# Patient Record
Sex: Female | Born: 1950 | Race: White | Hispanic: No | Marital: Single | Smoking: Former smoker
Health system: Southern US, Community
[De-identification: ages and names within clinical notes are randomized; demographics above are authoritative.]

## PROBLEM LIST (undated history)

## (undated) HISTORY — PX: HIATAL HERNIA REPAIR: SHX195

## (undated) HISTORY — PX: CHOLECYSTECTOMY: SHX55

---

## 2013-11-21 ENCOUNTER — Observation Stay: Payer: Self-pay | Admitting: Surgery

## 2013-11-21 LAB — CBC WITH DIFFERENTIAL/PLATELET
BASOS ABS: 0.1 10*3/uL (ref 0.0–0.1)
BASOS PCT: 0.7 %
EOS ABS: 0 10*3/uL (ref 0.0–0.7)
EOS PCT: 0.3 %
HCT: 43.3 % (ref 35.0–47.0)
HGB: 14.1 g/dL (ref 12.0–16.0)
LYMPHS ABS: 1.1 10*3/uL (ref 1.0–3.6)
Lymphocyte %: 10.4 %
MCH: 29.3 pg (ref 26.0–34.0)
MCHC: 32.5 g/dL (ref 32.0–36.0)
MCV: 90 fL (ref 80–100)
Monocyte #: 0.3 x10 3/mm (ref 0.2–0.9)
Monocyte %: 2.5 %
NEUTROS PCT: 86.1 %
Neutrophil #: 9.4 10*3/uL — ABNORMAL HIGH (ref 1.4–6.5)
PLATELETS: 304 10*3/uL (ref 150–440)
RBC: 4.81 10*6/uL (ref 3.80–5.20)
RDW: 13.4 % (ref 11.5–14.5)
WBC: 10.9 10*3/uL (ref 3.6–11.0)

## 2013-11-21 LAB — COMPREHENSIVE METABOLIC PANEL
ALBUMIN: 3.9 g/dL (ref 3.4–5.0)
ANION GAP: 9 (ref 7–16)
Alkaline Phosphatase: 81 U/L
BILIRUBIN TOTAL: 0.2 mg/dL (ref 0.2–1.0)
BUN: 12 mg/dL (ref 7–18)
CHLORIDE: 105 mmol/L (ref 98–107)
CREATININE: 0.95 mg/dL (ref 0.60–1.30)
Calcium, Total: 8.5 mg/dL (ref 8.5–10.1)
Co2: 24 mmol/L (ref 21–32)
EGFR (African American): >60
EGFR (Non-African Amer.): >60
Glucose: 139 mg/dL — ABNORMAL HIGH (ref 65–99)
OSMOLALITY: 278 (ref 275–301)
Potassium: 3.4 mmol/L — ABNORMAL LOW (ref 3.5–5.1)
SGOT(AST): 26 U/L (ref 15–37)
SGPT (ALT): 30 U/L
Sodium: 138 mmol/L (ref 136–145)
TOTAL PROTEIN: 8 g/dL (ref 6.4–8.2)

## 2013-11-21 LAB — URINALYSIS, COMPLETE
BACTERIA: NONE SEEN
Bilirubin,UR: NEGATIVE
Blood: NEGATIVE
Glucose,UR: 50 mg/dL (ref 0–75)
Leukocyte Esterase: NEGATIVE
Nitrite: NEGATIVE
Ph: 7 (ref 4.5–8.0)
Protein: NEGATIVE
SQUAMOUS EPITHELIAL: NONE SEEN
Specific Gravity: 1.016 (ref 1.003–1.030)
WBC UR: 1 /HPF (ref 0–5)

## 2013-11-21 LAB — TROPONIN I: Troponin-I: <0.02 ng/mL

## 2013-11-21 LAB — LIPASE, BLOOD: LIPASE: 171 U/L (ref 73–393)

## 2013-11-22 LAB — COMPREHENSIVE METABOLIC PANEL
ALBUMIN: 3 g/dL — AB (ref 3.4–5.0)
ALT: 95 U/L — AB
ANION GAP: 5 — AB (ref 7–16)
AST: 63 U/L — AB (ref 15–37)
Alkaline Phosphatase: 68 U/L
BUN: 14 mg/dL (ref 7–18)
Bilirubin,Total: 0.9 mg/dL (ref 0.2–1.0)
CHLORIDE: 101 mmol/L (ref 98–107)
CO2: 26 mmol/L (ref 21–32)
CREATININE: 1.31 mg/dL — AB (ref 0.60–1.30)
Calcium, Total: 7.9 mg/dL — ABNORMAL LOW (ref 8.5–10.1)
GFR CALC AF AMER: 50 — AB
GFR CALC NON AF AMER: 44 — AB
Glucose: 110 mg/dL — ABNORMAL HIGH (ref 65–99)
Osmolality: 266 (ref 275–301)
POTASSIUM: 4.7 mmol/L (ref 3.5–5.1)
SODIUM: 132 mmol/L — AB (ref 136–145)
Total Protein: 6.6 g/dL (ref 6.4–8.2)

## 2013-11-22 LAB — CBC WITH DIFFERENTIAL/PLATELET
BASOS ABS: 0 10*3/uL (ref 0.0–0.1)
Basophil %: 0.3 %
EOS PCT: 0.3 %
Eosinophil #: 0 10*3/uL (ref 0.0–0.7)
HCT: 35.8 % (ref 35.0–47.0)
HGB: 12 g/dL (ref 12.0–16.0)
LYMPHS PCT: 10.4 %
Lymphocyte #: 1.5 10*3/uL (ref 1.0–3.6)
MCH: 30.2 pg (ref 26.0–34.0)
MCHC: 33.5 g/dL (ref 32.0–36.0)
MCV: 90 fL (ref 80–100)
Monocyte #: 1 x10 3/mm — ABNORMAL HIGH (ref 0.2–0.9)
Monocyte %: 6.8 %
Neutrophil #: 12.1 10*3/uL — ABNORMAL HIGH (ref 1.4–6.5)
Neutrophil %: 82.2 %
Platelet: 250 10*3/uL (ref 150–440)
RBC: 3.97 10*6/uL (ref 3.80–5.20)
RDW: 13.7 % (ref 11.5–14.5)
WBC: 14.8 10*3/uL — ABNORMAL HIGH (ref 3.6–11.0)

## 2013-11-23 LAB — COMPREHENSIVE METABOLIC PANEL
ALBUMIN: 2.8 g/dL — AB (ref 3.4–5.0)
ALT: 83 U/L — AB
Alkaline Phosphatase: 76 U/L
Anion Gap: 5 — ABNORMAL LOW (ref 7–16)
BUN: 10 mg/dL (ref 7–18)
Bilirubin,Total: 0.7 mg/dL (ref 0.2–1.0)
CREATININE: 1.16 mg/dL (ref 0.60–1.30)
Calcium, Total: 9.1 mg/dL (ref 8.5–10.1)
Chloride: 108 mmol/L — ABNORMAL HIGH (ref 98–107)
Co2: 27 mmol/L (ref 21–32)
GFR CALC AF AMER: 58 — AB
GFR CALC NON AF AMER: 50 — AB
Glucose: 87 mg/dL (ref 65–99)
OSMOLALITY: 278 (ref 275–301)
Potassium: 3.9 mmol/L (ref 3.5–5.1)
SGOT(AST): 39 U/L — ABNORMAL HIGH (ref 15–37)
SODIUM: 140 mmol/L (ref 136–145)
TOTAL PROTEIN: 6.4 g/dL (ref 6.4–8.2)

## 2013-11-23 LAB — PATHOLOGY REPORT

## 2013-11-23 LAB — CBC WITH DIFFERENTIAL/PLATELET
BASOS PCT: 0.9 %
Basophil #: 0.1 10*3/uL (ref 0.0–0.1)
EOS ABS: 0.3 10*3/uL (ref 0.0–0.7)
Eosinophil %: 3 %
HCT: 37.1 % (ref 35.0–47.0)
HGB: 12.4 g/dL (ref 12.0–16.0)
Lymphocyte #: 1.9 10*3/uL (ref 1.0–3.6)
Lymphocyte %: 22 %
MCH: 29.9 pg (ref 26.0–34.0)
MCHC: 33.4 g/dL (ref 32.0–36.0)
MCV: 89 fL (ref 80–100)
MONO ABS: 0.6 x10 3/mm (ref 0.2–0.9)
MONOS PCT: 7.3 %
Neutrophil #: 5.8 10*3/uL (ref 1.4–6.5)
Neutrophil %: 66.8 %
PLATELETS: 256 10*3/uL (ref 150–440)
RBC: 4.15 10*6/uL (ref 3.80–5.20)
RDW: 13.8 % (ref 11.5–14.5)
WBC: 8.7 10*3/uL (ref 3.6–11.0)

## 2014-08-09 NOTE — Discharge Summary (Signed)
PATIENT NAME:  [name] CottaBLACKWELL, Chaniyah MR#:  [id number] DATE OF BIRTH:  Mar 26, 1951  DATE OF ADMISSION:  11/21/2013 DATE OF DISCHARGE:  11/23/2013  DIAGNOSES: Gallstones and acute cholecystitis.   PROCEDURES: Laparoscopic cholecystectomy.   HISTORY OF PRESENT ILLNESS AND HOSPITAL COURSE: This is a patient who was admitted to the hospital with right upper quadrant pain. A workup showed gallstones with, likely, acute cholecystitis. She was taken to the operating room where laparoscopic cholecystectomy was performed. She made an uncomplicated postoperative recovery and was discharged in stable condition to follow up in our office in 10 days on oral analgesics.    ____________________________ Adah Salvageichard E. Excell Seltzerooper, MD rec:ST D: 12/02/2013 19:23:17 ET T: 12/02/2013 22:03:25 ET JOB#: [id number]  cc: Adah Salvageichard E. Excell Seltzerooper, MD, <Dictator> Lattie HawICHARD E COOPER MD ELECTRONICALLY SIGNED 12/03/2013 6:39

## 2014-08-09 NOTE — Op Note (Signed)
PATIENT NAME:  [name] Mcguire, [name] Mcguire MR#:  [id number] DATE OF BIRTH:  09-Oct-1950  DATE OF PROCEDURE:  11/21/2013  PREOPERATIVE DIAGNOSIS: Acute cholecystitis.   POSTOPERATIVE DIAGNOSIS: Acute cholecystitis.   PROCEDURE: Laparoscopic cholecystectomy.   SURGEON: Dionne Miloichard Seamus Warehime, MD   ASSISTANT: Lynnell JudeA Limon, PA-S   INDICATIONS: This is a patient with unrelenting right upper quadrant pain associated with a workup showing gallstones and likely cholecystitis.  Preoperatively, we discussed rationale for surgery, the options of observation, risks of bleeding, infection, recurrence of symptoms, failure to resolve her symptoms, open procedure, bile duct damage, bile duct leak, retained common bile duct stone, any of which could require further surgery and/or ERCP, stent, and papillotomy. This was all reviewed for her in the preop holding area. She understood and agreed to proceed.   FINDINGS: Acute cholecystitis, edematous gallbladder.   DESCRIPTION OF PROCEDURE: The patient was induced to general anesthesia. She was on IV antibiotics and redosed. VTE prophylaxis was in place. She was prepped and draped in a sterile fashion. Marcaine was infiltrated in skin and subcutaneous tissues around the periumbilical area after she had been prepped and draped in a sterile fashion. An incision was made. A Veress needle was placed. Pneumoperitoneum was obtained, a 5-mm trocar port was placed. The abdominal cavity was explored and under direct vision, a 10-mm epigastric port and 2 lateral 5-mm ports were placed. The gallbladder was placed on tension. The peritoneum over the infundibulum was incised bluntly. The cystic duct and gallbladder junction was well identified. The cystic arterial branch was identified, doubly clipped, and divided and then the main branch of the cystic artery was doubly clipped and divided. This allowed for good visualization of the cystic duct as it entered the infundibulum of the gallbladder. It was  doubly clipped and divided and the gallbladder <<was taken from the>> the gallbladder fossa with electrocautery and passed out through the epigastric port site with the aid of an Endo Catch bag. The area was checked for hemostasis and found to be adequate, irrigated with copious amounts of normal saline. There was no sign of bleeding, bile leak, or bowel injury. Therefore, pneumoperitoneum was released. All ports were removed. Fascial edges at the epigastric site were approximated with figure-of-eight 0 Vicryl; 4-0 subcuticular Monocryl was used at all skin edges. Steri-Strips, Mastisol, and sterile dressings were placed.   The patient tolerated the procedure well. There were no complications. She was taken to the recovery room in stable condition to be admitted for continued care.    ____________________________ Adah Salvageichard E. Excell Seltzerooper, MD rec:lt D: 11/21/2013 13:55:27 ET T: 11/21/2013 19:13:43 ET JOB#: [id number]  cc: Adah Salvageichard E. Excell Seltzerooper, MD, <Dictator> Lattie HawICHARD E Dolores Ewing MD ELECTRONICALLY SIGNED 11/22/2013 10:02

## 2014-08-09 NOTE — H&P (Signed)
PATIENT NAME:  [name] Mcguire Mcguire, [name] Mcguire MR#:  [id number] DATE OF BIRTH:  1950-10-23  DATE OF ADMISSION:  11/21/2013  PRIMARY CARE PHYSICIAN: None.   ADMITTING PHYSICIAN: Dr. Michela PitcherEly.   CHIEF COMPLAINT: Abdominal pain, nausea and vomiting.   BRIEF HISTORY: [name] Mcguire Mcguire is a 64 year old woman seen in the Emergency Room with the sudden onset of mid epigastric right upper quadrant, right flank pain last evening. She was in her usual state of good health. She went out to dinner at a [employer] restaurant,  had a cheeseburger and french fries and developed sudden onset of right upper quadrant pain. The pain progressed to her back and shoulder and side. She began to vomit. The pain was quite severe and she came to the Emergency Room for further evaluation. Pain was controlled with IV narcotics. She had no fever noted. Laboratory values were largely unremarkable. Ultrasound demonstrated what appeared to be a 15 mm impacted cystic duct stone with a distended gallbladder, but no evidence of any gallbladder wall thickening, pericholecystic fluid or common duct obstruction. Surgical service was consulted.   She has had previous less severe abdominal symptoms which have result spontaneously. She denies any other episodes of nausea and vomiting. She has history of hepatitis, yellow jaundice, pancreatitis, previous diagnosis of gallbladder disease or diverticulitis. She carries a diagnosis of peptic ulcer disease having had symptoms many years ago. She had an endoscopy at that time. She takes omeprazole regularly. She denies a history of cardiac disease, hypertension, diabetes or thyroid problems. Only previous surgery was a right spigelian hernia performed many years ago.   SOCIAL HISTORY: She denies cigarette smoking and does not drink alcohol regularly.   MEDICATIONS: She takes no other medications.  ALLERGIES: She has no medical allergies.   REVIEW OF SYSTEMS: Otherwise unremarkable with exception of some recent  urinary frequency. She has no urgency or dysuria.   FAMILY HISTORY: Noncontributory to the current problem with the exception of some family history of abdominal problems felt to be diverticulitis. No other family members had gallbladder surgery as far she knows.   PHYSICAL EXAMINATION:  GENERAL: She is alert, but clearly uncomfortable. Her pain is returning.  VITAL SIGNS: Blood pressure is 160/80, heart rate is 57 and regular. She is afebrile. Pain scale is a 5 currently.  HEENT: Unremarkable. She is on anicteric. She has no facial deformity or pupillary abnormalities.  NECK: Supple, nontender with midline trachea. No adenopathy.  CHEST: Clear with no adventitious sounds. She has normal pulmonary excursion.  CARDIAC: With some murmurs and gallops to my ear. She seems to be in normal sinus rhythm.  ABDOMEN: Her abdomen is soft with some mild right upper quadrant tenderness and mild guarding, but no rebound. No hernias are noted. She has no masses noted. She has active bowel sounds.  EXTREMITIES: Lower extremity exam reveals full range of motion, no deformities and good distal pulses.  PSYCHIATRIC: Normal orientation, normal affect.   IMPRESSION: This woman appears to have symptomatic biliary colic with impacted cystic duct stone without evidence of acute cholecystitis. Her pain persists. In this situation we will plan to admit her to observation, consider surgical intervention today with my associate, Dr.  Excell Seltzerooper. We discussed risks and benefits with her in detail and she is in agreement.     ____________________________ Carmie Endalph L. Ely III, MD rle:jh D: 11/21/2013 05:34:27 ET T: 11/21/2013 05:44:27 ET JOB#: [id number]  cc: Quentin Orealph L. Ely III, MD, <Dictator> Quentin OreALPH L ELY MD ELECTRONICALLY SIGNED 11/25/2013 9:38

## 2015-03-17 ENCOUNTER — Telehealth: Payer: Self-pay

## 2015-03-17 NOTE — Telephone Encounter (Signed)
I spoke with this patient's sister and got clarity on how we can be of assistance to her.

## 2015-12-16 ENCOUNTER — Encounter: Payer: Self-pay | Admitting: Emergency Medicine

## 2015-12-16 ENCOUNTER — Observation Stay
Admission: EM | Admit: 2015-12-16 | Discharge: 2015-12-18 | Disposition: A | Discharge status: Home / Self Care | Payer: Medicare Other | Attending: Internal Medicine | Admitting: Internal Medicine

## 2015-12-16 DIAGNOSIS — K760 Fatty (change of) liver, not elsewhere classified: Secondary | ICD-10-CM | POA: Diagnosis not present

## 2015-12-16 DIAGNOSIS — K5732 Diverticulitis of large intestine without perforation or abscess without bleeding: Principal | ICD-10-CM | POA: Insufficient documentation

## 2015-12-16 DIAGNOSIS — E876 Hypokalemia: Secondary | ICD-10-CM | POA: Insufficient documentation

## 2015-12-16 DIAGNOSIS — D72829 Elevated white blood cell count, unspecified: Secondary | ICD-10-CM | POA: Insufficient documentation

## 2015-12-16 DIAGNOSIS — K219 Gastro-esophageal reflux disease without esophagitis: Secondary | ICD-10-CM | POA: Insufficient documentation

## 2015-12-16 DIAGNOSIS — R109 Unspecified abdominal pain: Secondary | ICD-10-CM | POA: Diagnosis present

## 2015-12-16 DIAGNOSIS — Z87891 Personal history of nicotine dependence: Secondary | ICD-10-CM | POA: Diagnosis not present

## 2015-12-16 DIAGNOSIS — K5730 Diverticulosis of large intestine without perforation or abscess without bleeding: Secondary | ICD-10-CM | POA: Diagnosis not present

## 2015-12-16 DIAGNOSIS — K5792 Diverticulitis of intestine, part unspecified, without perforation or abscess without bleeding: Secondary | ICD-10-CM | POA: Diagnosis present

## 2015-12-16 LAB — CBC
HEMATOCRIT: 42.3 % (ref 35.0–47.0)
Hemoglobin: 14.7 g/dL (ref 12.0–16.0)
MCH: 30.5 pg (ref 26.0–34.0)
MCHC: 34.7 g/dL (ref 32.0–36.0)
MCV: 88 fL (ref 80.0–100.0)
Platelets: 363 10*3/uL (ref 150–440)
RBC: 4.81 MIL/uL (ref 3.80–5.20)
RDW: 14.1 % (ref 11.5–14.5)
WBC: 11.4 10*3/uL — AB (ref 3.6–11.0)

## 2015-12-16 LAB — COMPREHENSIVE METABOLIC PANEL
ALBUMIN: 4.3 g/dL (ref 3.5–5.0)
ALT: 33 U/L (ref 14–54)
ANION GAP: 12 (ref 5–15)
AST: 29 U/L (ref 15–41)
Alkaline Phosphatase: 65 U/L (ref 38–126)
BUN: 24 mg/dL — AB (ref 6–20)
CHLORIDE: 109 mmol/L (ref 101–111)
CO2: 19 mmol/L — ABNORMAL LOW (ref 22–32)
Calcium: 9.2 mg/dL (ref 8.9–10.3)
Creatinine, Ser: 0.83 mg/dL (ref 0.44–1.00)
GFR calc Af Amer: >60 mL/min (ref >60)
GFR calc non Af Amer: >60 mL/min (ref >60)
GLUCOSE: 108 mg/dL — AB (ref 65–99)
POTASSIUM: 3.7 mmol/L (ref 3.5–5.1)
Sodium: 140 mmol/L (ref 135–145)
Total Bilirubin: 0.7 mg/dL (ref 0.3–1.2)
Total Protein: 7.6 g/dL (ref 6.5–8.1)

## 2015-12-16 LAB — URINALYSIS COMPLETE WITH MICROSCOPIC (ARMC ONLY)
BACTERIA UA: NONE SEEN
Bilirubin Urine: NEGATIVE
Glucose, UA: NEGATIVE mg/dL
LEUKOCYTES UA: NEGATIVE
Nitrite: NEGATIVE
PH: 5 (ref 5.0–8.0)
Protein, ur: 30 mg/dL — AB
Specific Gravity, Urine: 1.025 (ref 1.005–1.030)

## 2015-12-16 LAB — LIPASE, BLOOD: LIPASE: 25 U/L (ref 11–51)

## 2015-12-16 MED ORDER — HYDROMORPHONE HCL 1 MG/ML IJ SOLN
0.5000 mg | Freq: Once | INTRAMUSCULAR | Status: AC
  Administered 2015-12-16: 0.5 mg via INTRAVENOUS
  Filled 2015-12-16: qty 1

## 2015-12-16 MED ORDER — LORAZEPAM 2 MG/ML IJ SOLN
1.0000 mg | Freq: Once | INTRAMUSCULAR | Status: AC
  Administered 2015-12-17: 1 mg via INTRAVENOUS
  Filled 2015-12-16: qty 1

## 2015-12-16 MED ORDER — IOPAMIDOL (ISOVUE-300) INJECTION 61%
30.0000 mL | Freq: Once | INTRAVENOUS | Status: AC | PRN
Start: 2015-12-16 — End: 2015-12-16
  Administered 2015-12-16: 30 mL via ORAL

## 2015-12-16 MED ORDER — ONDANSETRON HCL 4 MG/2ML IJ SOLN
4.0000 mg | Freq: Once | INTRAMUSCULAR | Status: AC
  Administered 2015-12-16: 4 mg via INTRAVENOUS
  Filled 2015-12-16: qty 2

## 2015-12-16 NOTE — ED Provider Notes (Signed)
Time Seen: Approximately 2332  I have reviewed the triage notes  Chief Complaint: Abdominal Pain   History of Present Illness: [name] Mcguire is a 65 y.o. female who presents with onset of left lower quadrant abdominal pain that started earlier this afternoon approximately 4 PM. She states she's had some bowel urgency with 4 loose watery stools. She denies any melena or hematochezia. She is not aware of any obvious fever. She states she's been under a lot of stress recently and has some illness in the family. She's had a previous surgical history of a cholecystectomy and a hernia repair. She denies any nausea, vomiting. She denies any recent antibiotics. She did not take anything for pain prior to arrival.   History reviewed. No pertinent past medical history.  There are no active problems to display for this patient.   Past Surgical History:  Procedure Laterality Date  . CHOLECYSTECTOMY      Past Surgical History:  Procedure Laterality Date  . CHOLECYSTECTOMY        Allergies:  Review of patient's allergies indicates no known allergies.  Family History: No family history on file.  Social History: Social History  Substance Use Topics  . Smoking status: Former Smoker    Quit date: 05/17/1974  . Smokeless tobacco: Never Used  . Alcohol use No     Review of Systems:   10 point review of systems was performed and was otherwise negative:  Constitutional: No fever Eyes: No visual disturbances ENT: No sore throat, ear pain Cardiac: No chest pain Respiratory: No shortness of breath, wheezing, or stridor Abdomen: Pain is in the left lower quadrant without any radiation to the flank or right side of the abdomen Endocrine: No weight loss, No night sweats Extremities: No peripheral edema, cyanosis Skin: No rashes, easy bruising Neurologic: No focal weakness, trouble with speech or swollowing Urologic: No dysuria, Hematuria, or urinary frequency   Physical  Exam:  ED Triage Vitals  Enc Vitals Group     BP 12/16/15 2141 128/90     Pulse Rate 12/16/15 2141 (!) 110     Resp 12/16/15 2141 20     Temp 12/16/15 2141 98.6 F (37 C)     Temp Source 12/16/15 2141 Oral     SpO2 12/16/15 2141 95 %     Weight 12/16/15 2142 165 lb (74.8 kg)     Height 12/16/15 2142 5\' 2"  (1.575 m)     Head Circumference --      Peak Flow --      Pain Score 12/16/15 2143 8     Pain Loc --      Pain Edu? --      Excl. in GC? --     General: Awake , Alert , and Oriented times 3; GCS 15 Very anxious Head: Normal cephalic , atraumatic Eyes: Pupils equal , round, reactive to light Nose/Throat: No nasal drainage, patent upper airway without erythema or exudate.  Neck: Supple, Full range of motion, No anterior adenopathy or palpable thyroid masses Lungs: Clear to ascultation without wheezes , rhonchi, or rales Heart: Regular rate, regular rhythm without murmurs , gallops , or rubs Abdomen: Difficult exam due to anxiety , tender in the left lower and middle quadrants without rebound, guarding , or rigidity; bowel sounds positive and symmetric in all 4 quadrants. No organomegaly .        Extremities: 2 plus symmetric pulses. No edema, clubbing or cyanosis Neurologic: normal ambulation, Motor symmetric without deficits,  sensory intact Skin: warm, dry, no rashes   Labs:   All laboratory work was reviewed including any pertinent negatives or positives listed below:  Labs Reviewed  COMPREHENSIVE METABOLIC PANEL - Abnormal; Notable for the following:       Result Value   CO2 19 (*)    Glucose, Bld 108 (*)    BUN 24 (*)    All other components within normal limits  CBC - Abnormal; Notable for the following:    WBC 11.4 (*)    All other components within normal limits  URINALYSIS COMPLETEWITH MICROSCOPIC ([facility] ONLY) - Abnormal; Notable for the following:    Color, Urine YELLOW (*)    APPearance CLEAR (*)    Ketones, ur 1+ (*)    Hgb urine dipstick 1+ (*)     Protein, ur 30 (*)    Squamous Epithelial / LPF 0-5 (*)    All other components within normal limits  LIPASE, BLOOD     Radiology: "Ct Abdomen Pelvis W Contrast  Result Date: 12/17/2015 CLINICAL DATA:  Left lower quadrant abdominal pain for 4 hours. Diarrhea. EXAM: CT ABDOMEN AND PELVIS WITH CONTRAST TECHNIQUE: Multidetector CT imaging of the abdomen and pelvis was performed using the standard protocol following bolus administration of intravenous contrast. CONTRAST:  ISOVUE-300 IOPAMIDOL (ISOVUE-300) INJECTION 61% COMPARISON:  Ultrasound upper abdomen 11/21/2013 FINDINGS: Atelectasis in the lung bases. Residual contrast material in the distal esophagus without dilatation may represent reflux or dysmotility. Multiple circumscribed low-attenuation lesions in the liver measuring less than 1 cm in diameter, likely representing cysts. Mild diffuse fatty infiltration of the liver. Surgical absence of the gallbladder. No bile duct dilatation. The pancreas, spleen, kidneys, adrenal glands, abdominal aorta, inferior vena cava, and retroperitoneal lymph nodes are unremarkable. Stomach, small bowel, and colon are not abnormally distended. No free air or free fluid in the abdomen. Pelvis: Diverticulosis of the sigmoid colon. Inflammatory stranding around the lower descending and upper sigmoid colon consistent with acute diverticulitis. No abscess. Appendix is normal. Uterus and ovaries are not enlarged. Bladder wall is not thickened. No pelvic mass or lymphadenopathy. No free or loculated pelvic fluid collections. Degenerative changes in the spine. No destructive bone lesions. IMPRESSION: Diverticulosis of the colon with inflammatory changes at the junction of the sigmoid and descending colon consistent with acute diverticulitis. No abscess. Electronically Signed   By: Burman Nieves M.D.   On: 12/17/2015 01:12  "  I personally reviewed the radiologic studies    ED Course:  differential is somewhat  limited given the acute nature of left lower quadrant abdominal pain. Differential diagnosis includes but is not exclusive to cyst, , acute appendicitis, urinary tract infection, endometriosis, bowel obstruction, colitis, renal colic, gastroenteritis, etc.  Given the patient's current clinical presentation and objective findings appears that she has significant diverticulitis. There does not appear to be any bowel perforation or abscess at this time. Patient was started on IV Flagyl.  Clinical Course     Assessment:  Acute diverticulitis      Plan: * Inpatient I spoke to the hospitalist team, further disposition and management depends upon their evaluation.            Jennye Moccasin, MD 12/17/15 [phone]

## 2015-12-16 NOTE — ED Triage Notes (Signed)
Pt presents to ED with c/o LLQ abdominal pain for the past 4 hours, pt reports when pain started she began to have diarrhea episodes. Denies blood in stool. Pt denies nausea or vomiting, chest pain. Pt denies urinary symptoms. Pt alert and oriented x 4, no increased work in breathing.

## 2015-12-17 ENCOUNTER — Encounter: Payer: Self-pay | Admitting: Radiology

## 2015-12-17 ENCOUNTER — Emergency Department: Payer: Medicare Other

## 2015-12-17 DIAGNOSIS — R109 Unspecified abdominal pain: Secondary | ICD-10-CM | POA: Diagnosis present

## 2015-12-17 DIAGNOSIS — K5792 Diverticulitis of intestine, part unspecified, without perforation or abscess without bleeding: Secondary | ICD-10-CM | POA: Diagnosis present

## 2015-12-17 LAB — BASIC METABOLIC PANEL
ANION GAP: 5 (ref 5–15)
BUN: 18 mg/dL (ref 6–20)
CALCIUM: 8.2 mg/dL — AB (ref 8.9–10.3)
CO2: 26 mmol/L (ref 22–32)
CREATININE: 0.84 mg/dL (ref 0.44–1.00)
Chloride: 106 mmol/L (ref 101–111)
GLUCOSE: 116 mg/dL — AB (ref 65–99)
Potassium: 3.4 mmol/L — ABNORMAL LOW (ref 3.5–5.1)
Sodium: 137 mmol/L (ref 135–145)

## 2015-12-17 LAB — CBC
HEMATOCRIT: 38 % (ref 35.0–47.0)
Hemoglobin: 12.9 g/dL (ref 12.0–16.0)
MCH: 30.3 pg (ref 26.0–34.0)
MCHC: 34.1 g/dL (ref 32.0–36.0)
MCV: 88.9 fL (ref 80.0–100.0)
PLATELETS: 312 10*3/uL (ref 150–440)
RBC: 4.28 MIL/uL (ref 3.80–5.20)
RDW: 14.4 % (ref 11.5–14.5)
WBC: 9.3 10*3/uL (ref 3.6–11.0)

## 2015-12-17 MED ORDER — LEVOFLOXACIN IN D5W 750 MG/150ML IV SOLN
750.0000 mg | Freq: Once | INTRAVENOUS | Status: AC
  Administered 2015-12-17: 750 mg via INTRAVENOUS
  Filled 2015-12-17: qty 150

## 2015-12-17 MED ORDER — POTASSIUM CHLORIDE 20 MEQ PO PACK
40.0000 meq | PACK | Freq: Once | ORAL | Status: AC
  Administered 2015-12-17: 40 meq via ORAL
  Filled 2015-12-17: qty 2

## 2015-12-17 MED ORDER — ACETAMINOPHEN 325 MG PO TABS
650.0000 mg | ORAL_TABLET | Freq: Four times a day (QID) | ORAL | Status: DC | PRN
Start: 2015-12-17 — End: 2015-12-18
  Administered 2015-12-17: 650 mg via ORAL
  Filled 2015-12-17: qty 2

## 2015-12-17 MED ORDER — ENOXAPARIN SODIUM 40 MG/0.4ML ~~LOC~~ SOLN
40.0000 mg | Freq: Every day | SUBCUTANEOUS | Status: DC
  Administered 2015-12-17 – 2015-12-18 (×2): 40 mg via SUBCUTANEOUS
  Filled 2015-12-17 (×2): qty 0.4

## 2015-12-17 MED ORDER — KETOROLAC TROMETHAMINE 15 MG/ML IJ SOLN
15.0000 mg | Freq: Four times a day (QID) | INTRAMUSCULAR | Status: DC | PRN
Start: 2015-12-17 — End: 2015-12-18
  Administered 2015-12-17: 15 mg via INTRAVENOUS
  Filled 2015-12-17: qty 1

## 2015-12-17 MED ORDER — METRONIDAZOLE IN NACL 5-0.79 MG/ML-% IV SOLN
500.0000 mg | Freq: Three times a day (TID) | INTRAVENOUS | Status: DC
  Administered 2015-12-17 – 2015-12-18 (×3): 500 mg via INTRAVENOUS
  Filled 2015-12-17 (×6): qty 100

## 2015-12-17 MED ORDER — METRONIDAZOLE IN NACL 5-0.79 MG/ML-% IV SOLN
500.0000 mg | Freq: Once | INTRAVENOUS | Status: AC
  Administered 2015-12-17: 500 mg via INTRAVENOUS
  Filled 2015-12-17: qty 100

## 2015-12-17 MED ORDER — ONDANSETRON HCL 4 MG PO TABS: 4.0000 mg | ORAL_TABLET | Freq: Four times a day (QID) | ORAL | Status: DC | PRN

## 2015-12-17 MED ORDER — LEVOFLOXACIN IN D5W 750 MG/150ML IV SOLN
750.0000 mg | INTRAVENOUS | Status: DC
  Filled 2015-12-17: qty 150

## 2015-12-17 MED ORDER — IOPAMIDOL (ISOVUE-300) INJECTION 61%
100.0000 mL | Freq: Once | INTRAVENOUS | Status: AC | PRN
Start: 2015-12-17 — End: 2015-12-17
  Administered 2015-12-17: 100 mL via INTRAVENOUS

## 2015-12-17 MED ORDER — ONDANSETRON HCL 4 MG/2ML IJ SOLN: 4.0000 mg | Freq: Four times a day (QID) | INTRAMUSCULAR | Status: DC | PRN

## 2015-12-17 MED ORDER — SODIUM CHLORIDE 0.9 % IV SOLN
INTRAVENOUS | Status: DC
  Administered 2015-12-17 (×2): via INTRAVENOUS

## 2015-12-17 MED ORDER — ACETAMINOPHEN 650 MG RE SUPP: 650.0000 mg | Freq: Four times a day (QID) | RECTAL | Status: DC | PRN

## 2015-12-17 MED ORDER — FAMOTIDINE IN NACL 20-0.9 MG/50ML-% IV SOLN
20.0000 mg | Freq: Two times a day (BID) | INTRAVENOUS | Status: DC
  Administered 2015-12-17 (×3): 20 mg via INTRAVENOUS
  Filled 2015-12-17 (×5): qty 50

## 2015-12-17 MED ORDER — OXYCODONE HCL 5 MG PO TABS: 5.0000 mg | ORAL_TABLET | Freq: Four times a day (QID) | ORAL | Status: DC | PRN

## 2015-12-17 NOTE — Care Management Note (Signed)
Case Management Note  Patient Details  Name: [name] Mcguire MRN: [id number] Date of Birth: 1950/12/23  Subjective/Objective:    From home and independent. Drives self and continues to work. Denies issues with meds. Stated feeling much better today.                Action/Plan: Anticipated discharge is home with self care.   Expected Discharge Date:  11/16/16               Expected Discharge Plan:  Home/Self Care  In-House Referral:     Discharge planning Services  CM Consult  Post Acute Care Choice:    Choice offered to:     DME Arranged:    DME Agency:     HH Arranged:    HH Agency:     Status of Service:  Completed, signed off  If discussed at [employer] Length of Stay Meetings, dates discussed:    Additional Comments:  Adonis HugueninBerkhead, Tareek Sabo L, RN 12/17/2015, 2:43 PM

## 2015-12-17 NOTE — Progress Notes (Signed)
[facility] [place] [facility]   PATIENT NAME: [name] Mcguire    MR#:  [id number]  DATE OF BIRTH:  04/18/1951  SUBJECTIVE:  CHIEF COMPLAINT:  Patient's left sided abdominal pain is improving. Tolerating clear liquid diet and requesting to advance her diet. Denies any nausea or vomiting. No other complaints.  REVIEW OF SYSTEMS:  CONSTITUTIONAL: No fever, fatigue or weakness.  EYES: No blurred or double vision.  EARS, NOSE, AND THROAT: No tinnitus or ear pain.  RESPIRATORY: No cough, shortness of breath, wheezing or hemoptysis.  CARDIOVASCULAR: No chest pain, orthopnea, edema.  GASTROINTESTINAL: No nausea, vomiting, diarrhea . Endorses improved left lower quadrant abdominal pain.  GENITOURINARY: No dysuria, hematuria.  ENDOCRINE: No polyuria, nocturia,  HEMATOLOGY: No anemia, easy bruising or bleeding SKIN: No rash or lesion. MUSCULOSKELETAL: No joint pain or arthritis.   NEUROLOGIC: No tingling, numbness, weakness.  PSYCHIATRY: No anxiety or depression.   DRUG ALLERGIES:  No Known Allergies  VITALS:  Blood pressure 128/72, pulse 88, temperature 98.8 F (37.1 C), temperature source Oral, resp. rate 18, height 5\' 2"  (1.575 m), weight 74.8 kg (165 lb), SpO2 98 %.  PHYSICAL EXAMINATION:  GENERAL:  65 y.o.-year-old patient lying in the bed with no acute distress.  EYES: Pupils equal, round, reactive to light and accommodation. No scleral icterus. Extraocular muscles intact.  HEENT: Head atraumatic, normocephalic. Oropharynx and nasopharynx clear.  NECK:  Supple, no jugular venous distention. No thyroid enlargement, no tenderness.  LUNGS: Normal breath sounds bilaterally, no wheezing, rales,rhonchi or crepitation. No use of accessory muscles of respiration.  CARDIOVASCULAR: S1, S2 normal. No murmurs, rubs, or gallops.  ABDOMEN: Soft, Minimal left lower quadrant tenderness, no rebound tenderness  nondistended. Bowel sounds present. No organomegaly or  mass.  EXTREMITIES: No pedal edema, cyanosis, or clubbing.  NEUROLOGIC: Cranial nerves II through XII are intact. Muscle strength 5/5 in all extremities. Sensation intact. Gait not checked.  PSYCHIATRIC: The patient is alert and oriented x 3.  SKIN: No obvious rash, lesion, or ulcer.    LABORATORY PANEL:   CBC  Recent Labs Lab 12/17/15 0524  WBC 9.3  HGB 12.9  HCT 38.0  PLT 312   ------------------------------------------------------------------------------------------------------------------  Chemistries   Recent Labs Lab 12/16/15 2146 12/17/15 0524  NA 140 137  K 3.7 3.4*  CL 109 106  CO2 19* 26  GLUCOSE 108* 116*  BUN 24* 18  CREATININE 0.83 0.84  CALCIUM 9.2 8.2*  AST 29  --   ALT 33  --   ALKPHOS 65  --   BILITOT 0.7  --    ------------------------------------------------------------------------------------------------------------------  Cardiac Enzymes No results for input(s): TROPONINI in the last 168 hours. ------------------------------------------------------------------------------------------------------------------  RADIOLOGY:  Ct Abdomen Pelvis W Contrast  Result Date: 12/17/2015 CLINICAL DATA:  Left lower quadrant abdominal pain for 4 hours. Diarrhea. EXAM: CT ABDOMEN AND PELVIS WITH CONTRAST TECHNIQUE: Multidetector CT imaging of the abdomen and pelvis was performed using the standard protocol following bolus administration of intravenous contrast. CONTRAST:  <MEASUREM505<MEASUREMEN785<MEASUREMEN607<MEASUREMEN(802)<MEASUREMEN206<MEASUREMEN941<MEASUREMEN229<MEASUREMEN539<MEASUREMEN425<MEASUREMEN646<[id number]-300 IOPAMIDOL (ISOVUE-300) INJECTION 61% COMPARISON:  Ultrasound upper abdomen 11/21/2013 FINDINGS: Atelectasis in the lung bases. Residual contrast material in the distal esophagus without dilatation may represent reflux or dysmotility. Multiple circumscribed low-attenuation lesions in the liver measuring less than 1 cm in diameter, likely representing cysts. Mild diffuse fatty infiltration of the liver. Surgical absence of the gallbladder. No bile duct dilatation. The pancreas,  spleen, kidneys, adrenal glands, abdominal aorta, inferior vena cava, and retroperitoneal lymph nodes are unremarkable. Stomach,  small bowel, and colon are not abnormally distended. No free air or free fluid in the abdomen. Pelvis: Diverticulosis of the sigmoid colon. Inflammatory stranding around the lower descending and upper sigmoid colon consistent with acute diverticulitis. No abscess. Appendix is normal. Uterus and ovaries are not enlarged. Bladder wall is not thickened. No pelvic mass or lymphadenopathy. No free or loculated pelvic fluid collections. Degenerative changes in the spine. No destructive bone lesions. IMPRESSION: Diverticulosis of the colon with inflammatory changes at the junction of the sigmoid and descending colon consistent with acute diverticulitis. No abscess. Electronically Signed   By: Burman NievesWilliam  Stevens M.D.   On: 12/17/2015 01:12    EKG:   Orders placed or performed in visit on 11/21/13  . EKG 12-Lead    ASSESSMENT AND PLAN:   65 year old female patient presented to the emergency room with abdominal pain.  1. Acute sigmoid diverticulitis Clinically improving Advance diet to full liquids Continue levofloxacin and Flagyl   2. Abdominal pain-left lower quadrant Clinically improving. Continue pain management  3. Leukocytosis secondary to diverticulitis Improved  Check a.m. Labs The patient is afebrile  4 . hypokalemia replete and check BMP in a.m.    All the records are reviewed and case discussed with Care Management/Social Workerr. Management plans discussed with the patient, family and they are in agreement.  CODE STATUS: Full code  TOTAL TIME TAKING CARE OF THIS PATIENT: 33minutes.   POSSIBLE D/C IN am  DAYS, DEPENDING ON CLINICAL CONDITION.  Note: This dictation was prepared with Dragon dictation along with smaller phrase technology. Any transcriptional errors that result from this process are unintentional.   Ramonita LabGouru, Anshika Pethtel M.D on 12/17/2015 at 2:28  PM  Between 7am to 6pm - Pager - [phone] After 6pm go to [email or web address] - password [email or web address] [email or web address]  [facility]  Office  [phone]  CC: Primary care physician; No PCP Per Patient

## 2015-12-17 NOTE — Progress Notes (Signed)
Pharmacy Antibiotic Note  [name] ShamsVanessa Ellen Mcguire is a 65 y.o. female admitted on 12/16/2015 with IAI.  Pharmacy has been consulted for levofloxacin dosing.  Plan: Levofloxacin 750 mg IV Q24H  Height: 5\' 2"  (157.5 cm) Weight: 165 lb (74.8 kg) IBW/kg (Calculated) : 50.1  Temp (24hrs), Avg:98.6 F (37 C), Min:98.6 F (37 C), Max:98.6 F (37 C)   Recent Labs Lab 12/16/15 2146  WBC 11.4*  CREATININE 0.83    Estimated Creatinine Clearance: 64 mL/min (by C-G formula based on SCr of 0.83 mg/dL).    No Known Allergies   Thank you for allowing pharmacy to be a part of this patient's care.  Carola FrostNathan A Norville Dani, Pharm.D., BCPS Clinical Pharmacist 12/17/2015 3:40 AM

## 2015-12-17 NOTE — H&P (Signed)
[facility] - [place] [facility]   PATIENT NAME: [name] Mcguire    MR#:  [id number]  DATE OF BIRTH:  08-Jun-1950  DATE OF ADMISSION:  12/16/2015  PRIMARY CARE PHYSICIAN: No PCP Per Patient   REQUESTING/REFERRING PHYSICIAN:   CHIEF COMPLAINT:   Chief Complaint  Patient presents with  . Abdominal Pain    HISTORY OF PRESENT ILLNESS: [name] CottaVanessa Mcguire  is a 65 y.o. female with No significant past medical history presented to the emergency room with abdominal pain which started around 4 PM yesterday evening. Abdominal pain is located in the left lower quadrant. Pain is non radiating. Abdominal pain is sharp in nature 10 out of 10 initially a scale of 1-10. No history of any nausea or vomiting. No complaints of any rectal bleed. No history of hemoptysis or hematemesis. No history of recent travel or sick contacts at home. Patient was evaluated in the emergency room with CT abdomen which showed a sigmoid diverticulitis. Patient received IV antibiotics in the emergency room. Hospitalist service was consulted for further care of the patient. No complaints of any chest pain, shortness of breath.  PAST MEDICAL HISTORY:  History reviewed. No pertinent past medical history.  PAST SURGICAL HISTORY: Past Surgical History:  Procedure Laterality Date  . CHOLECYSTECTOMY    . HIATAL HERNIA REPAIR      SOCIAL HISTORY:  Social History  Substance Use Topics  . Smoking status: Former Smoker    Quit date: 05/17/1974  . Smokeless tobacco: Never Used  . Alcohol use No    FAMILY HISTORY:  Family History  Problem Relation Age of Onset  . Diverticulitis Mother   . Ulcerative colitis Brother     DRUG ALLERGIES: No Known Allergies  REVIEW OF SYSTEMS:   CONSTITUTIONAL: No fever, fatigue or weakness.  EYES: No blurred or double vision.  EARS, NOSE, AND THROAT: No tinnitus or ear pain.  RESPIRATORY: No cough, shortness of breath, wheezing or hemoptysis.  CARDIOVASCULAR:  No chest pain, orthopnea, edema.  GASTROINTESTINAL: No nausea, vomiting, diarrhea  Has abdominal pain ad constipation GENITOURINARY: No dysuria, hematuria.  ENDOCRINE: No polyuria, nocturia,  HEMATOLOGY: No anemia, easy bruising or bleeding SKIN: No rash or lesion. MUSCULOSKELETAL: No joint pain or arthritis.   NEUROLOGIC: No tingling, numbness, weakness.  PSYCHIATRY: No anxiety or depression.   MEDICATIONS AT HOME:  Prior to Admission medications   Medication Sig Start Date End Date Taking? Authorizing Provider  esomeprazole (NEXIUM) 20 MG capsule Take 20 mg by mouth daily at 12 noon.   Yes Historical Provider, MD  traMADol (ULTRAM) 50 MG tablet Take 50 mg by mouth every 6 (six) hours as needed.   Yes Historical Provider, MD      PHYSICAL EXAMINATION:   VITAL SIGNS: Blood pressure 128/82, pulse 78, temperature 98.6 F (37 C), temperature source Oral, resp. rate 15, height 5\' 2"  (1.575 m), weight 74.8 kg (165 lb), SpO2 95 %.  GENERAL:  65 y.o.-year-old well built female patient lying in the bed with no acute distress.  EYES: Pupils equal, round, reactive to light and accommodation. No scleral icterus. Extraocular muscles intact.  HEENT: Head atraumatic, normocephalic. Oropharynx and nasopharynx clear.  NECK:  Supple, no jugular venous distention. No thyroid enlargement, no tenderness.  LUNGS: Normal breath sounds bilaterally, no wheezing, rales,rhonchi or crepitation. No use of accessory muscles of respiration.  CARDIOVASCULAR: S1, S2 normal. No murmurs, rubs, or gallops.  ABDOMEN: Soft, tenderness in left lower quadrant noted, nondistended. Bowel sounds decreased.  No organomegaly or mass.  EXTREMITIES: No pedal edema, cyanosis, or clubbing.  NEUROLOGIC: Cranial nerves II through XII are intact. Muscle strength 5/5 in all extremities. Sensation intact. Gait normal. PSYCHIATRIC: The patient is alert and oriented x 3.  SKIN: No obvious rash, lesion, or ulcer.   LABORATORY PANEL:    CBC  Recent Labs Lab 12/16/15 2146  WBC 11.4*  HGB 14.7  HCT 42.3  PLT 363  MCV 88.0  MCH 30.5  MCHC 34.7  RDW 14.1   ------------------------------------------------------------------------------------------------------------------  Chemistries   Recent Labs Lab 12/16/15 2146  NA 140  K 3.7  CL 109  CO2 19*  GLUCOSE 108*  BUN 24*  CREATININE 0.83  CALCIUM 9.2  AST 29  ALT 33  ALKPHOS 65  BILITOT 0.7   ------------------------------------------------------------------------------------------------------------------ estimated creatinine clearance is 64 mL/min (by C-G formula based on SCr of 0.83 mg/dL). ------------------------------------------------------------------------------------------------------------------ No results for input(s): TSH, T4TOTAL, T3FREE, THYROIDAB in the last 72 hours.  Invalid input(s): FREET3   Coagulation profile No results for input(s): INR, PROTIME in the last 168 hours. ------------------------------------------------------------------------------------------------------------------- No results for input(s): DDIMER in the last 72 hours. -------------------------------------------------------------------------------------------------------------------  Cardiac Enzymes No results for input(s): CKMB, TROPONINI, MYOGLOBIN in the last 168 hours.  Invalid input(s): CK ------------------------------------------------------------------------------------------------------------------ Invalid input(s): POCBNP  ---------------------------------------------------------------------------------------------------------------  Urinalysis    Component Value Date/Time   COLORURINE YELLOW (A) 12/16/2015 2146   APPEARANCEUR CLEAR (A) 12/16/2015 2146   APPEARANCEUR Clear 11/21/2013 0428   LABSPEC 1.025 12/16/2015 2146   LABSPEC 1.016 11/21/2013 0428   PHURINE 5.0 12/16/2015 2146   GLUCOSEU NEGATIVE 12/16/2015 2146   GLUCOSEU 50 mg/dL  40/98/1191 4782   HGBUR 1+ (A) 12/16/2015 2146   BILIRUBINUR NEGATIVE 12/16/2015 2146   BILIRUBINUR Negative 11/21/2013 0428   KETONESUR 1+ (A) 12/16/2015 2146   PROTEINUR 30 (A) 12/16/2015 2146   NITRITE NEGATIVE 12/16/2015 2146   LEUKOCYTESUR NEGATIVE 12/16/2015 2146   LEUKOCYTESUR Negative 11/21/2013 0428     RADIOLOGY: Ct Abdomen Pelvis W Contrast  Result Date: 12/17/2015 CLINICAL DATA:  Left lower quadrant abdominal pain for 4 hours. Diarrhea. EXAM: CT ABDOMEN AND PELVIS WITH CONTRAST TECHNIQUE: Multidetector CT imaging of the abdomen and pelvis was performed using the standard protocol following bolus administration of intravenous contrast. CONTRAST:  ISOVUE-300 IOPAMIDOL (ISOVUE-300) INJECTION 61% COMPARISON:  Ultrasound upper abdomen 11/21/2013 FINDINGS: Atelectasis in the lung bases. Residual contrast material in the distal esophagus without dilatation may represent reflux or dysmotility. Multiple circumscribed low-attenuation lesions in the liver measuring less than 1 cm in diameter, likely representing cysts. Mild diffuse fatty infiltration of the liver. Surgical absence of the gallbladder. No bile duct dilatation. The pancreas, spleen, kidneys, adrenal glands, abdominal aorta, inferior vena cava, and retroperitoneal lymph nodes are unremarkable. Stomach, small bowel, and colon are not abnormally distended. No free air or free fluid in the abdomen. Pelvis: Diverticulosis of the sigmoid colon. Inflammatory stranding around the lower descending and upper sigmoid colon consistent with acute diverticulitis. No abscess. Appendix is normal. Uterus and ovaries are not enlarged. Bladder wall is not thickened. No pelvic mass or lymphadenopathy. No free or loculated pelvic fluid collections. Degenerative changes in the spine. No destructive bone lesions. IMPRESSION: Diverticulosis of the colon with inflammatory changes at the junction of the sigmoid and descending colon consistent with acute  diverticulitis. No abscess. Electronically Signed   By: Burman Nieves M.D.   On: 12/17/2015 01:12    EKG: Orders placed or performed in visit on 11/21/13  . EKG 12-Lead  IMPRESSION AND PLAN: 65 year old female patient presented to the emergency room with abdominal pain. Admitting diagnosis 1. Acute sigmoid diverticulitis 2. Abdominal pain 3. Leukocytosis secondary to diverticulitis Treatment plan Admit patient to medical floor observation bed IV fluid hydration Start patient on IV Levaquin and IV Flagyl antibiotic Pain management Follow-up WBC count Supportive care.  All the records are reviewed and case discussed with ED provider. Management plans discussed with the patient, family and they are in agreement.  CODE STATUS:FULL Code Status History    This patient does not have a recorded code status. Please follow your organizational policy for patients in this situation.       TOTAL TIME TAKING CARE OF THIS PATIENT: 50 minutes.    Ihor Austin M.D on 12/17/2015 at 3:32 AM  Between 7am to 6pm - Pager - [phone]  After 6pm go to [email or web address] - password [email or web address] [facility]  [facility]  Office  [phone]  CC: Primary care physician; No PCP Per Patient

## 2015-12-17 NOTE — Care Management Obs Status (Signed)
MEDICARE OBSERVATION STATUS NOTIFICATION   Patient Details  Name: [name] Mcguire MRN: [id number] Date of Birth: [birth year]   Medicare Observation Status Notification Given:  Yes    Adonis HugueninBerkhead, Jaxson Anglin L, RN 12/17/2015, 2:45 PM

## 2015-12-18 LAB — CBC
HCT: 34.8 % — ABNORMAL LOW (ref 35.0–47.0)
HEMOGLOBIN: 11.9 g/dL — AB (ref 12.0–16.0)
MCH: 30.8 pg (ref 26.0–34.0)
MCHC: 34.2 g/dL (ref 32.0–36.0)
MCV: 89.9 fL (ref 80.0–100.0)
Platelets: 283 10*3/uL (ref 150–440)
RBC: 3.87 MIL/uL (ref 3.80–5.20)
RDW: 14.3 % (ref 11.5–14.5)
WBC: 6.9 10*3/uL (ref 3.6–11.0)

## 2015-12-18 LAB — BASIC METABOLIC PANEL
ANION GAP: 7 (ref 5–15)
BUN: 9 mg/dL (ref 6–20)
CALCIUM: 8 mg/dL — AB (ref 8.9–10.3)
CO2: 22 mmol/L (ref 22–32)
Chloride: 114 mmol/L — ABNORMAL HIGH (ref 101–111)
Creatinine, Ser: 0.71 mg/dL (ref 0.44–1.00)
Glucose, Bld: 85 mg/dL (ref 65–99)
Potassium: 3.6 mmol/L (ref 3.5–5.1)
SODIUM: 143 mmol/L (ref 135–145)

## 2015-12-18 MED ORDER — LEVOFLOXACIN 500 MG PO TABS
500.0000 mg | ORAL_TABLET | Freq: Every day | ORAL | Status: DC
  Administered 2015-12-18: 500 mg via ORAL
  Filled 2015-12-18: qty 1

## 2015-12-18 MED ORDER — METRONIDAZOLE 500 MG PO TABS
500.0000 mg | ORAL_TABLET | Freq: Three times a day (TID) | ORAL | Status: DC
  Administered 2015-12-18: 500 mg via ORAL
  Filled 2015-12-18: qty 1

## 2015-12-18 MED ORDER — LEVOFLOXACIN 500 MG PO TABS: 500.0000 mg | ORAL_TABLET | Freq: Every day | ORAL | 0 refills | Status: AC

## 2015-12-18 MED ORDER — POTASSIUM CHLORIDE CRYS ER 20 MEQ PO TBCR
40.0000 meq | EXTENDED_RELEASE_TABLET | Freq: Once | ORAL | Status: AC
  Administered 2015-12-18: 40 meq via ORAL
  Filled 2015-12-18: qty 2

## 2015-12-18 MED ORDER — METRONIDAZOLE 500 MG PO TABS: 500.0000 mg | ORAL_TABLET | Freq: Three times a day (TID) | ORAL | 0 refills | Status: AC

## 2015-12-18 NOTE — Progress Notes (Signed)
Pt being discharged home today. PIV removed. She was given her prescriptions to pick up after discharge. All discharge instructions and medication education reviewed with pt and all questions/concerns answered. She is leaving with all her belongings. Will be transported via family.

## 2015-12-18 NOTE — Discharge Instructions (Signed)
Diverticulitis  Diverticulitis is when small pockets that have formed in your colon (large intestine) become infected or swollen.  HOME CARE  · Follow your doctor's instructions.  · Follow a special diet if told by your doctor.  · When you feel better, your doctor may tell you to change your diet. You may be told to eat a lot of fiber. Fruits and vegetables are good sources of fiber. Fiber makes it easier to poop (have bowel movements).  · Take supplements or probiotics as told by your doctor.  · Only take medicines as told by your doctor.  · Keep all follow-up visits with your doctor.  GET HELP IF:  · Your pain does not get better.  · You have a hard time eating food.  · You are not pooping like normal.  GET HELP RIGHT AWAY IF:  · Your pain gets worse.  · Your problems do not get better.  · Your problems suddenly get worse.  · You have a fever.  · You keep throwing up (vomiting).  · You have bloody or black, tarry poop (stool).  MAKE SURE YOU:   · Understand these instructions.  · Will watch your condition.  · Will get help right away if you are not doing well or get worse.     This information is not intended to replace advice given to you by your health care provider. Make sure you discuss any questions you have with your health care provider.     Document Released: 09/21/2007 Document Revised: 04/09/2013 Document Reviewed: 02/27/2013  Elsevier Interactive Patient Education ©2016 [employer]

## 2015-12-18 NOTE — Discharge Summary (Signed)
[facility] - [place] [facility]   PATIENT NAME: [name] Mcguire    MR#:  [id number]  DATE OF BIRTH:  [birth year]  DATE OF ADMISSION:  12/16/2015 ADMITTING PHYSICIAN: Ihor AustinPavan Pyreddy, MD  DATE OF DISCHARGE: 12/18/2015 10:45 AM  PRIMARY CARE PHYSICIAN: Dr. Ellsworth Lennoxejan-sie    ADMISSION DIAGNOSIS:  Diverticulitis of large intestine without perforation or abscess without bleeding [K57.32]  DISCHARGE DIAGNOSIS:  Principal Problem:   Acute diverticulitis Active Problems:   Abdominal pain   SECONDARY DIAGNOSIS:  History reviewed. No pertinent past medical history.  HOSPITAL COURSE:   1. Acute diverticulitis with abdominal pain and leukocytosis. Patient was started on IV Levaquin and Flagyl. The patient did improve during the hospital course. On the day of discharge she stated that she has to get out here today because her sister is going home with hospice. She still had some pain in the left lower quadrant but did not feel that she needed any pain medications. She was advanced on her diet and prescribed oral Levaquin and Flagyl upon discharge home. 2. GERD on Nexium   DISCHARGE CONDITIONS:   Satisfactory  CONSULTS OBTAINED:   none  DRUG ALLERGIES:  No Known Allergies  DISCHARGE MEDICATIONS:   Discharge Medication List as of 12/18/2015 10:25 AM    START taking these medications   Details  levofloxacin (LEVAQUIN) 500 MG tablet Take 1 tablet (500 mg total) by mouth daily., Starting Fri 12/18/2015, Print    metroNIDAZOLE (FLAGYL) 500 MG tablet Take 1 tablet (500 mg total) by mouth every 8 (eight) hours., Starting Fri 12/18/2015, Print      CONTINUE these medications which have NOT CHANGED   Details  esomeprazole (NEXIUM) 20 MG capsule Take 20 mg by mouth daily at 12 noon., Historical Med    traMADol (ULTRAM) 50 MG tablet Take 50 mg by mouth every 6 (six) hours as needed., Historical Med         DISCHARGE INSTRUCTIONS:   Follow-up with Dr. Ellsworth Lennoxejan-sie one  week  If you experience worsening of your admission symptoms, develop shortness of breath, life threatening emergency, suicidal or homicidal thoughts you must seek medical attention immediately by calling 911 or calling your MD immediately  if symptoms less severe.  You Must read complete instructions/literature along with all the possible adverse reactions/side effects for all the Medicines you take and that have been prescribed to you. Take any new Medicines after you have completely understood and accept all the possible adverse reactions/side effects.   Please note  You were cared for by a hospitalist during your hospital stay. If you have any questions about your discharge medications or the care you received while you were in the hospital after you are discharged, you can call the unit and asked to speak with the hospitalist on call if the hospitalist that took care of you is not available. Once you are discharged, your primary care physician will handle any further medical issues. Please note that NO REFILLS for any discharge medications will be authorized once you are discharged, as it is imperative that you return to your primary care physician (or establish a relationship with a primary care physician if you do not have one) for your aftercare needs so that they can reassess your need for medications and monitor your lab values.    Today   CHIEF COMPLAINT:   Chief Complaint  Patient presents with  . Abdominal Pain    HISTORY OF PRESENT ILLNESS:  [name] Mcguire  is a  65 y.o. female presented with abdominal pain and found to have diverticulitis   VITAL SIGNS:  Blood pressure 133/86, pulse 61, temperature 98.2 F (36.8 C), temperature source Oral, resp. rate 18, height 5\' 2"  (1.575 m), weight 74.8 kg (165 lb), SpO2 96 %.    PHYSICAL EXAMINATION:  GENERAL:  66 y.o.-year-old patient lying in the bed with no acute distress.  EYES: Pupils equal, round, reactive to light and  accommodation. No scleral icterus. Extraocular muscles intact.  HEENT: Head atraumatic, normocephalic. Oropharynx and nasopharynx clear.  NECK:  Supple, no jugular venous distention. No thyroid enlargement, no tenderness.  LUNGS: Normal breath sounds bilaterally, no wheezing, rales,rhonchi or crepitation. No use of accessory muscles of respiration.  CARDIOVASCULAR: S1, S2 normal. No murmurs, rubs, or gallops.  ABDOMEN: Soft, Only slight left lower quadrant tenderness when she stands up, non-distended. Bowel sounds present. No organomegaly or mass.  EXTREMITIES: No pedal edema, cyanosis, or clubbing.  NEUROLOGIC: Cranial nerves II through XII are intact. Muscle strength 5/5 in all extremities. Sensation intact. Gait not checked.  PSYCHIATRIC: The patient is alert and oriented x 3.  SKIN: No obvious rash, lesion, or ulcer.   DATA REVIEW:   CBC  Recent Labs Lab 12/18/15 0342  WBC 6.9  HGB 11.9*  HCT 34.8*  PLT 283    Chemistries   Recent Labs Lab 12/16/15 2146  12/18/15 0342  NA 140  < > 143  K 3.7  < > 3.6  CL 109  < > 114*  CO2 19*  < > 22  GLUCOSE 108*  < > 85  BUN 24*  < > 9  CREATININE 0.83  < > 0.71  CALCIUM 9.2  < > 8.0*  AST 29  --   --   ALT 33  --   --   ALKPHOS 65  --   --   BILITOT 0.7  --   --   < > = values in this interval not displayed.    RADIOLOGY:  Ct Abdomen Pelvis W Contrast  Result Date: 12/17/2015 CLINICAL DATA:  Left lower quadrant abdominal pain for 4 hours. Diarrhea. EXAM: CT ABDOMEN AND PELVIS WITH CONTRAST TECHNIQUE: Multidetector CT imaging of the abdomen and pelvis was performed using the standard protocol following bolus administration of intravenous contrast. CONTRAST:  ISOVUE-300 IOPAMIDOL (ISOVUE-300) INJECTION 61% COMPARISON:  Ultrasound upper abdomen 11/21/2013 FINDINGS: Atelectasis in the lung bases. Residual contrast material in the distal esophagus without dilatation may represent reflux or dysmotility. Multiple circumscribed  low-attenuation lesions in the liver measuring less than 1 cm in diameter, likely representing cysts. Mild diffuse fatty infiltration of the liver. Surgical absence of the gallbladder. No bile duct dilatation. The pancreas, spleen, kidneys, adrenal glands, abdominal aorta, inferior vena cava, and retroperitoneal lymph nodes are unremarkable. Stomach, small bowel, and colon are not abnormally distended. No free air or free fluid in the abdomen. Pelvis: Diverticulosis of the sigmoid colon. Inflammatory stranding around the lower descending and upper sigmoid colon consistent with acute diverticulitis. No abscess. Appendix is normal. Uterus and ovaries are not enlarged. Bladder wall is not thickened. No pelvic mass or lymphadenopathy. No free or loculated pelvic fluid collections. Degenerative changes in the spine. No destructive bone lesions. IMPRESSION: Diverticulosis of the colon with inflammatory changes at the junction of the sigmoid and descending colon consistent with acute diverticulitis. No abscess. Electronically Signed   By: Burman Nieves M.D.   On: 12/17/2015 01:12    Management plans discussed with the patient,  family and they are in agreement.  CODE STATUS:  Code Status History    Date Active Date Inactive Code Status Order ID Comments User Context   12/17/2015  4:31 AM 12/18/2015  1:50 PM Full Code [id number]  Ihor Austin, MD Inpatient      TOTAL TIME TAKING CARE OF THIS PATIENT: 35 minutes.    Alford Highland M.D on 12/18/2015 at 3:57 PM  Between 7am to 6pm - Pager - [phone]  After 6pm go to [email or web address] - password [email or web address]  [facility] Office  [phone]  CC: Primary care physician; Dr. Ellsworth Lennox

## 2017-08-15 IMAGING — CT CT ABD-PELV W/ CM
2 of 5 series · 16 of 46 positions shown, 18 images · IV contrast (iopamidol)
Comparison: Ultrasound upper abdomen 11/21/2013

CLINICAL DATA: Left lower quadrant abdominal pain for 4 hours.
Diarrhea.

EXAM:
CT ABDOMEN AND PELVIS WITH CONTRAST
TECHNIQUE: Multidetector CT imaging of the abdomen and pelvis was performed
using the standard protocol following bolus administration of
intravenous contrast.
CONTRAST:  100mL [id number] IOPAMIDOL (0HL85W-1II) INJECTION 61%

[Series 2: axial st · axial · 0.90mm/px · z∈[-1042,-632]mm · 13 of 94 slices shown, 15 images]
[im 6/94  soft-tissue]
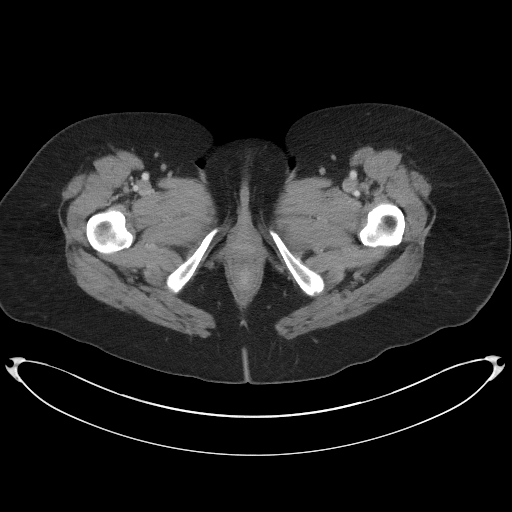
[im 6/94  bone]
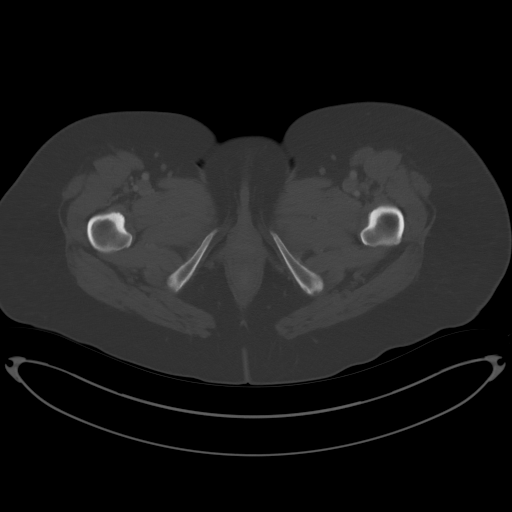
[im 11/94  soft-tissue]
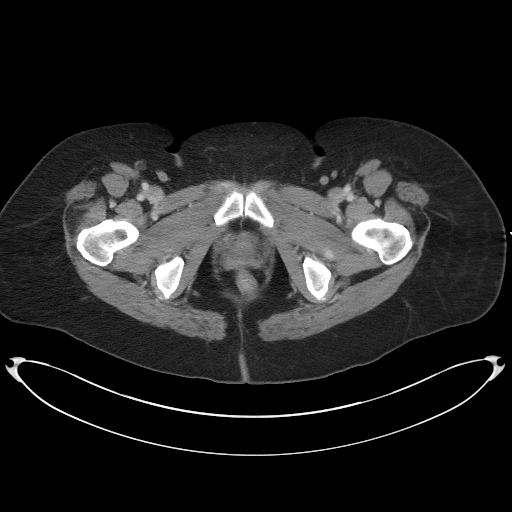
[im 21/94  soft-tissue]
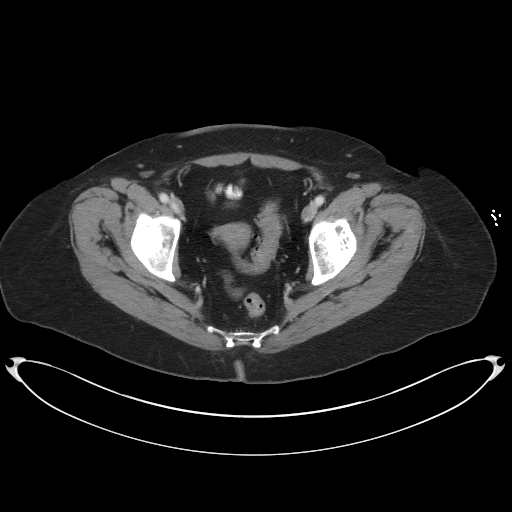
[im 26/94  soft-tissue]
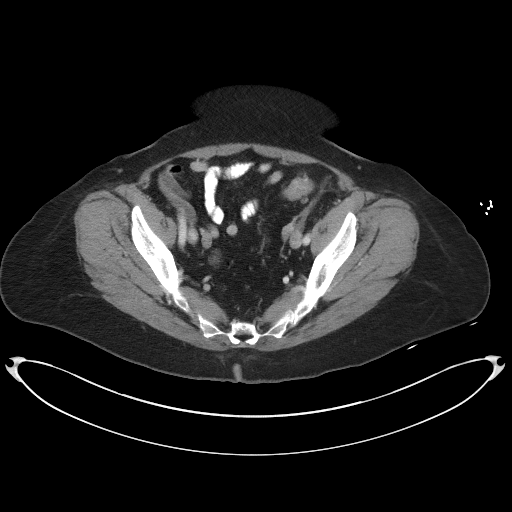
[im 32/94  soft-tissue]
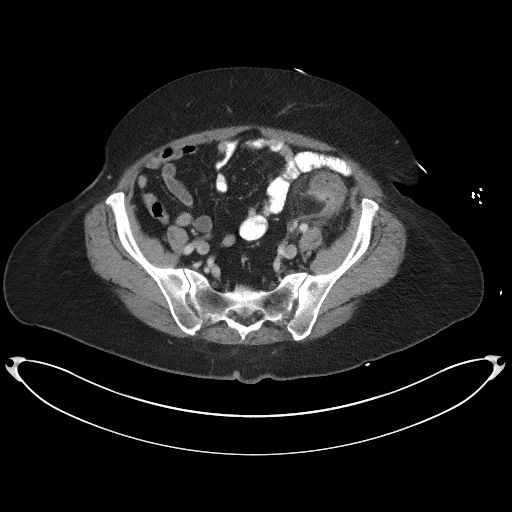
[im 42/94  soft-tissue]
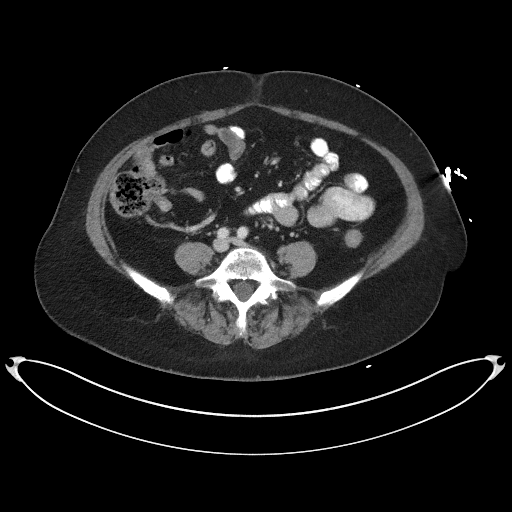
[im 47/94  soft-tissue]
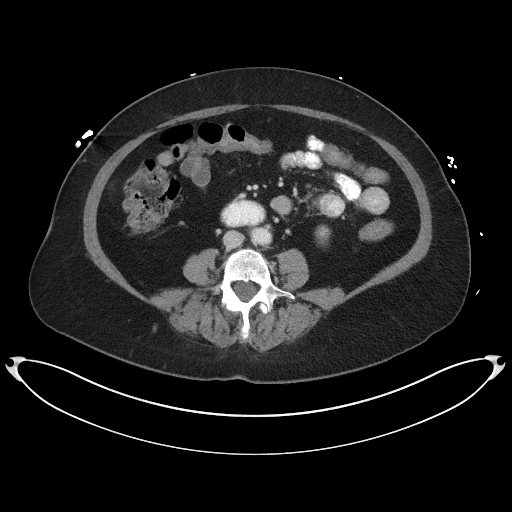
[im 52/94  soft-tissue]
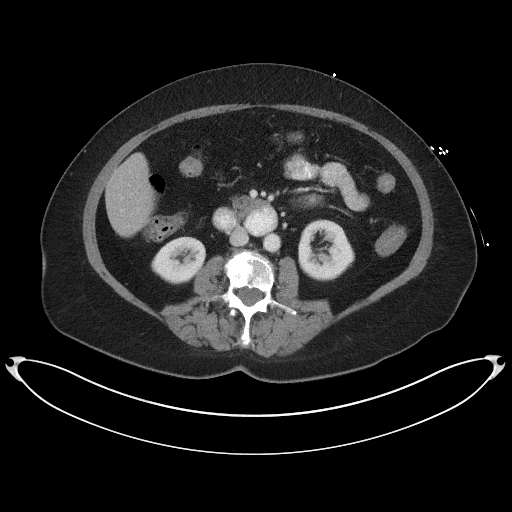
[im 63/94  soft-tissue]
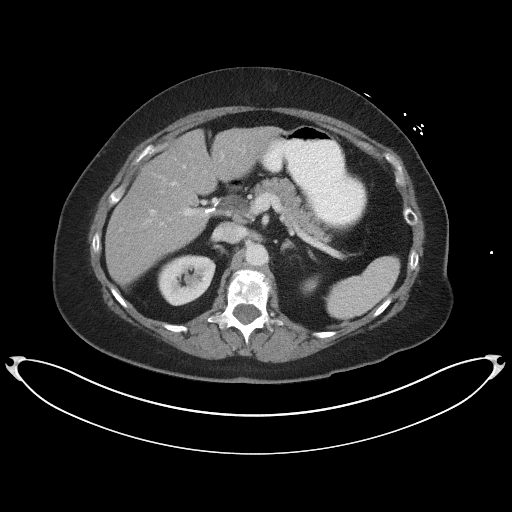
[im 63/94  bone]
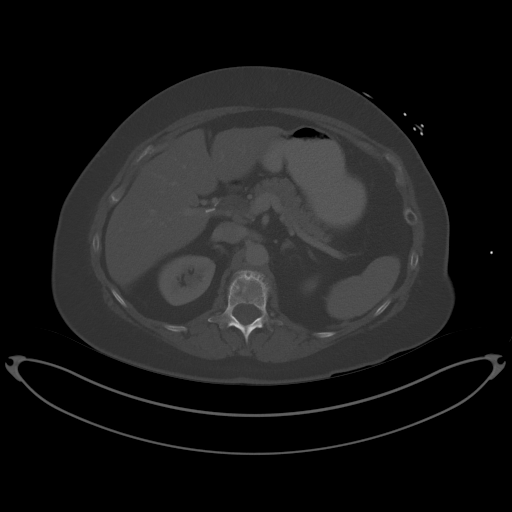
[im 68/94  soft-tissue]
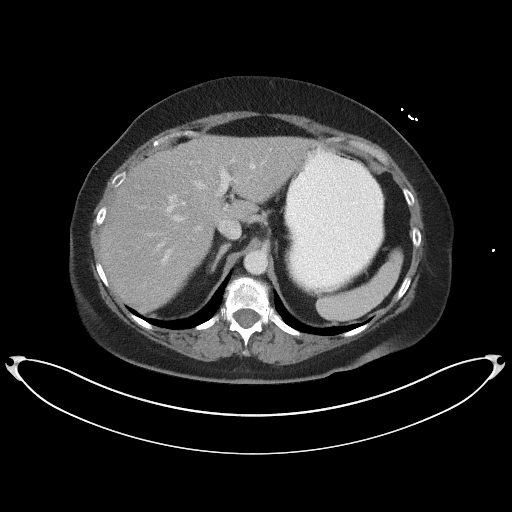
[im 73/94  soft-tissue]
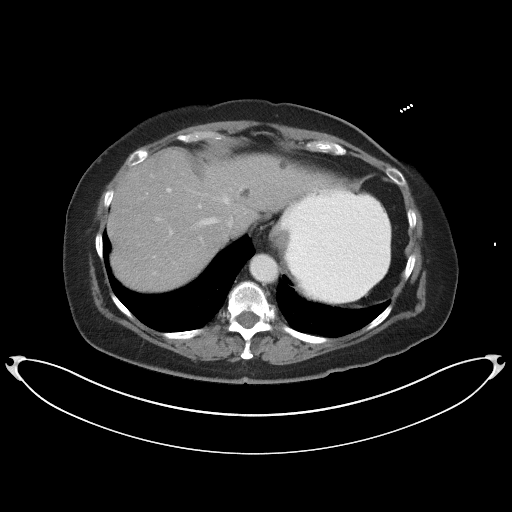
[im 83/94  soft-tissue]
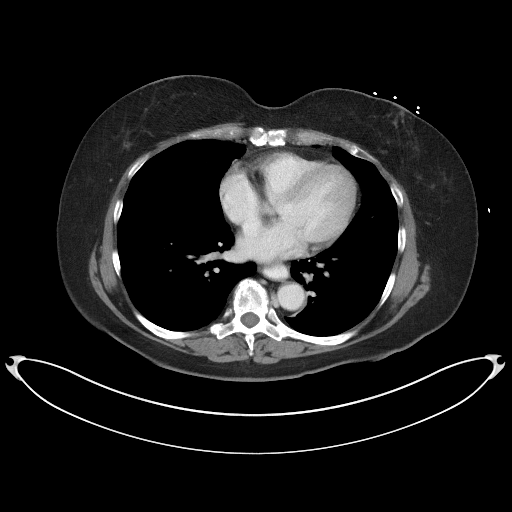
[im 88/94  soft-tissue]
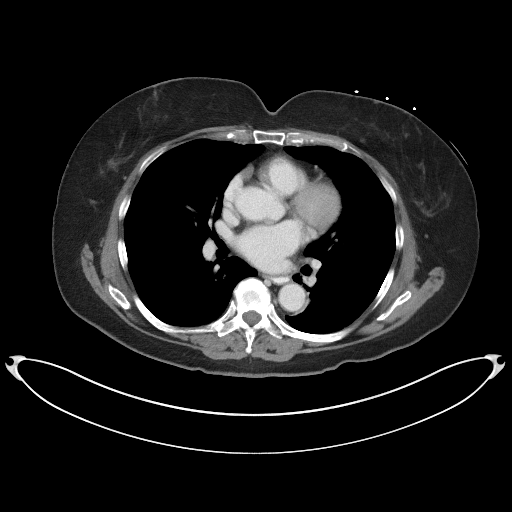

[Series 5: coronal st · coronal · 0.79mm/px · 3 of 91 slices shown]
[im 31/91  soft-tissue]
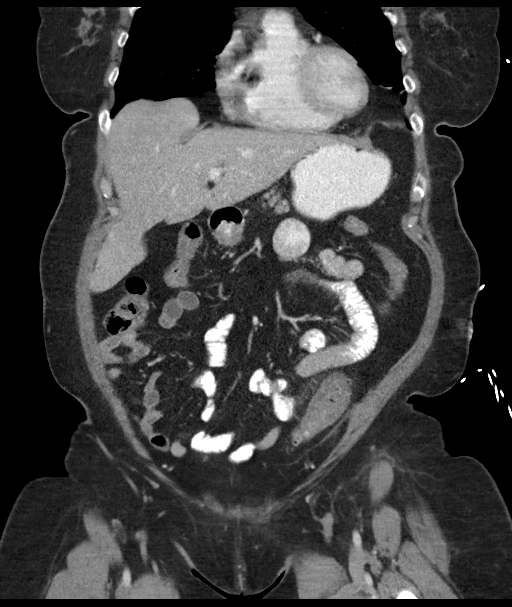
[im 41/91  soft-tissue]
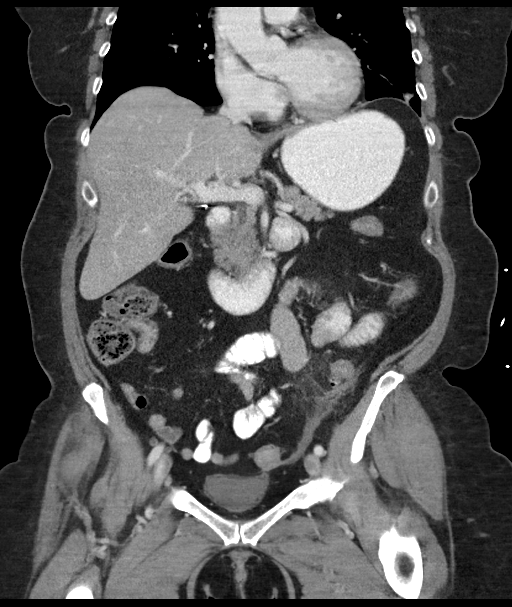
[im 51/91  soft-tissue]
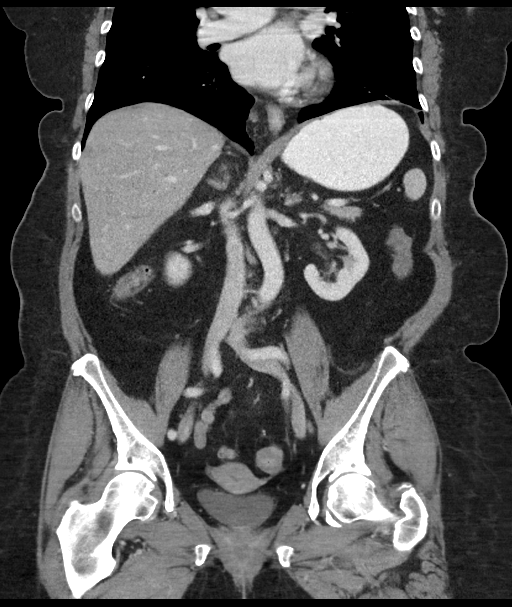

[16 of 46 positions shown; findings below may reference images not displayed]

FINDINGS: Atelectasis in the lung bases. Residual contrast material in the
distal esophagus without dilatation may represent reflux or
dysmotility.

Multiple circumscribed low-attenuation lesions in the liver
measuring less than 1 cm in diameter, likely representing cysts.
Mild diffuse fatty infiltration of the liver. Surgical absence of
the gallbladder. No bile duct dilatation. The pancreas, spleen,
kidneys, adrenal glands, abdominal aorta, inferior vena cava, and
retroperitoneal lymph nodes are unremarkable. Stomach, small bowel,
and colon are not abnormally distended. No free air or free fluid in
the abdomen.

Pelvis: Diverticulosis of the sigmoid colon. Inflammatory stranding
around the lower descending and upper sigmoid colon consistent with
acute diverticulitis. No abscess. Appendix is normal. Uterus and
ovaries are not enlarged. Bladder wall is not thickened. No pelvic
mass or lymphadenopathy. No free or loculated pelvic fluid
collections. Degenerative changes in the spine. No destructive bone
lesions.
IMPRESSION: Diverticulosis of the colon with inflammatory changes at the
junction of the sigmoid and descending colon consistent with acute
diverticulitis. No abscess.

## 2018-08-13 DIAGNOSIS — H0015 Chalazion left lower eyelid: Secondary | ICD-10-CM | POA: Diagnosis not present

## 2018-09-11 DIAGNOSIS — K5792 Diverticulitis of intestine, part unspecified, without perforation or abscess without bleeding: Secondary | ICD-10-CM | POA: Diagnosis not present

## 2018-09-11 DIAGNOSIS — R1032 Left lower quadrant pain: Secondary | ICD-10-CM | POA: Diagnosis not present

## 2019-12-25 DIAGNOSIS — H52213 Irregular astigmatism, bilateral: Secondary | ICD-10-CM | POA: Diagnosis not present

## 2019-12-25 DIAGNOSIS — H5213 Myopia, bilateral: Secondary | ICD-10-CM | POA: Diagnosis not present

## 2019-12-25 DIAGNOSIS — H2513 Age-related nuclear cataract, bilateral: Secondary | ICD-10-CM | POA: Diagnosis not present

## 2019-12-25 DIAGNOSIS — H25013 Cortical age-related cataract, bilateral: Secondary | ICD-10-CM | POA: Diagnosis not present

## 2019-12-25 DIAGNOSIS — H524 Presbyopia: Secondary | ICD-10-CM | POA: Diagnosis not present

## 2024-02-19 NOTE — Progress Notes (Signed)
 [name] Mcguire                                          MRN: [id number]   02/19/2024   The [facility] Quality Team Specialist reviewed this patient medical record for the purposes of chart review for care gap closure. The following were reviewed: chart review for care gap closure-breast cancer screening and colorectal cancer screening.    [facility] Quality Team
